# Patient Record
Sex: Female | Born: 1955 | Race: White | Hispanic: No | Smoking: Never smoker
Health system: Southern US, Community
[De-identification: ages and names within clinical notes are randomized; demographics above are authoritative.]

## PROBLEM LIST (undated history)

## (undated) DIAGNOSIS — C50919 Malignant neoplasm of unspecified site of unspecified female breast: Secondary | ICD-10-CM

---

## 2013-04-25 ENCOUNTER — Encounter (HOSPITAL_BASED_OUTPATIENT_CLINIC_OR_DEPARTMENT_OTHER): Payer: Self-pay | Admitting: Emergency Medicine

## 2013-04-25 ENCOUNTER — Emergency Department (HOSPITAL_BASED_OUTPATIENT_CLINIC_OR_DEPARTMENT_OTHER)
Admission: EM | Admit: 2013-04-25 | Discharge: 2013-04-25 | Disposition: A | Discharge status: Home / Self Care | Payer: BC Managed Care – PPO | Attending: Emergency Medicine | Admitting: Emergency Medicine

## 2013-04-25 ENCOUNTER — Emergency Department (HOSPITAL_BASED_OUTPATIENT_CLINIC_OR_DEPARTMENT_OTHER): Payer: BC Managed Care – PPO

## 2013-04-25 DIAGNOSIS — R220 Localized swelling, mass and lump, head: Secondary | ICD-10-CM | POA: Insufficient documentation

## 2013-04-25 DIAGNOSIS — IMO0002 Reserved for concepts with insufficient information to code with codable children: Secondary | ICD-10-CM | POA: Insufficient documentation

## 2013-04-25 DIAGNOSIS — C50919 Malignant neoplasm of unspecified site of unspecified female breast: Secondary | ICD-10-CM | POA: Insufficient documentation

## 2013-04-25 DIAGNOSIS — M6281 Muscle weakness (generalized): Secondary | ICD-10-CM | POA: Insufficient documentation

## 2013-04-25 DIAGNOSIS — F29 Unspecified psychosis not due to a substance or known physiological condition: Secondary | ICD-10-CM | POA: Insufficient documentation

## 2013-04-25 DIAGNOSIS — G9389 Other specified disorders of brain: Secondary | ICD-10-CM

## 2013-04-25 DIAGNOSIS — Z9889 Other specified postprocedural states: Secondary | ICD-10-CM | POA: Insufficient documentation

## 2013-04-25 DIAGNOSIS — Z853 Personal history of malignant neoplasm of breast: Secondary | ICD-10-CM | POA: Insufficient documentation

## 2013-04-25 DIAGNOSIS — R221 Localized swelling, mass and lump, neck: Principal | ICD-10-CM

## 2013-04-25 HISTORY — DX: Malignant neoplasm of unspecified site of unspecified female breast: C50.919

## 2013-04-25 LAB — COMPREHENSIVE METABOLIC PANEL
ALK PHOS: 176 U/L — AB (ref 39–117)
ALT: 52 U/L — AB (ref 0–35)
AST: 41 U/L — AB (ref 0–37)
Albumin: 3.7 g/dL (ref 3.5–5.2)
BILIRUBIN TOTAL: 1.4 mg/dL — AB (ref 0.3–1.2)
BUN: 14 mg/dL (ref 6–23)
CHLORIDE: 101 meq/L (ref 96–112)
CO2: 23 meq/L (ref 19–32)
Calcium: 8.7 mg/dL (ref 8.4–10.5)
Creatinine, Ser: 0.7 mg/dL (ref 0.50–1.10)
GFR calc Af Amer: >90 mL/min (ref >90)
Glucose, Bld: 209 mg/dL — ABNORMAL HIGH (ref 70–99)
Potassium: 3.8 mEq/L (ref 3.7–5.3)
SODIUM: 140 meq/L (ref 137–147)
Total Protein: 6.3 g/dL (ref 6.0–8.3)

## 2013-04-25 LAB — CBC
HEMATOCRIT: 42.1 % (ref 36.0–46.0)
HEMOGLOBIN: 14.2 g/dL (ref 12.0–15.0)
MCH: 32.7 pg (ref 26.0–34.0)
MCHC: 33.7 g/dL (ref 30.0–36.0)
MCV: 97 fL (ref 78.0–100.0)
Platelets: 108 10*3/uL — ABNORMAL LOW (ref 150–400)
RBC: 4.34 MIL/uL (ref 3.87–5.11)
RDW: 17.2 % — ABNORMAL HIGH (ref 11.5–15.5)
WBC: 17.9 10*3/uL — ABNORMAL HIGH (ref 4.0–10.5)

## 2013-04-25 LAB — DIFFERENTIAL
BAND NEUTROPHILS: 16 % — AB (ref 0–10)
BASOS PCT: 0 % (ref 0–1)
Basophils Absolute: 0 10*3/uL (ref 0.0–0.1)
Eosinophils Absolute: 0 10*3/uL (ref 0.0–0.7)
Eosinophils Relative: 0 % (ref 0–5)
LYMPHS PCT: 2 % — AB (ref 12–46)
Lymphs Abs: 0.4 10*3/uL — ABNORMAL LOW (ref 0.7–4.0)
Monocytes Absolute: 0.4 10*3/uL (ref 0.1–1.0)
Monocytes Relative: 2 % — ABNORMAL LOW (ref 3–12)
Neutro Abs: 17.1 10*3/uL — ABNORMAL HIGH (ref 1.7–7.7)
Neutrophils Relative %: 80 % — ABNORMAL HIGH (ref 43–77)

## 2013-04-25 LAB — PROTIME-INR
INR: 1.24 (ref 0.00–1.49)
Prothrombin Time: 15.3 seconds — ABNORMAL HIGH (ref 11.6–15.2)

## 2013-04-25 LAB — TROPONIN I: Troponin I: <0.3 ng/mL (ref <0.30)

## 2013-04-25 LAB — ETHANOL: Alcohol, Ethyl (B): <11 mg/dL (ref 0–11)

## 2013-04-25 LAB — APTT: APTT: 172 s — AB (ref 24–37)

## 2013-04-25 MED ORDER — IOHEXOL 350 MG/ML SOLN
80.0000 mL | Freq: Once | INTRAVENOUS | Status: AC | PRN
  Administered 2013-04-25: 80 mL via INTRAVENOUS

## 2013-04-25 MED ORDER — CEFTRIAXONE SODIUM 1 G IJ SOLR
INTRAMUSCULAR | Status: AC
  Filled 2013-04-25: qty 10

## 2013-04-25 MED ORDER — METHYLPREDNISOLONE SODIUM SUCC 125 MG IJ SOLR
125.0000 mg | Freq: Once | INTRAMUSCULAR | Status: AC
  Administered 2013-04-25: 125 mg via INTRAVENOUS
  Filled 2013-04-25: qty 2

## 2013-04-25 MED ORDER — DEXTROSE 5 % IV SOLN
1.0000 g | Freq: Once | INTRAVENOUS | Status: AC
  Administered 2013-04-25: 1 g via INTRAVENOUS

## 2013-04-25 NOTE — ED Notes (Signed)
MD at bedside. 

## 2013-04-25 NOTE — ED Provider Notes (Signed)
CSN: [id number]     Arrival date & time 04/25/13  1555 History  This chart was scribed for Shaune Pollack, MD by Vernell Barrier, ED scribe. This patient was seen in room MH02/MH02 and the patient's care was started at 4:27 PM. Level V caveat patient confusion Chief Complaint  Patient presents with  . Altered Mental Status   Patient is a 58 y.o. female presenting with altered mental status. The history is provided by the patient and a friend. No language interpreter was used.  Altered Mental Status Presenting symptoms: confusion and disorientation   Severity:  Mild Most recent episode:  Today Progression:  Worsening Context: recent illness   Associated symptoms: no fever and no headaches    HPI Comments: Jordan Morgan is a 58 y.o. female who presents to the Emergency Department brought in by her friend with altered mental status beginning approximately 11 AM this morning. Pt is unable to tell where she is, her birthday, and today's date. Pts friend says pt is disoriented and completely different from how she was 1 day prior.  Pt says she is having trouble finding words. Pt friend states she was unsteady walking in to the ER but was able to get in and out of the car. Pt is currently receiving chemotherapy for right breast cancer and has had a right mastectomy. Last chemo was 3 days ago. Pt denies smoking. Denies any other medical problems. Denies fever or HA. Further history obtained from daughter after she arrived. Daughter states that she has had some word finding difficulty for 6-8 weeks. It has been intermittent and not severe previously. Have not mentioned this to her oncologist. She has been taking chemotherapy to Fridays on and 1 Friday off with the last chemotherapy 9 days ago. Initial breast cancer was in 2007 time she had a mastectomy. She had recurrence of breast cancer in 2013 and has been on maintenance chemotherapy since that time. No past medical history on file. No past surgical  history on file. No family history on file. History  Substance Use Topics  . Smoking status: Not on file  . Smokeless tobacco: Not on file  . Alcohol Use: Not on file   OB History   No data available     Review of Systems  Constitutional: Negative for fever.  Neurological: Negative for headaches.  Psychiatric/Behavioral: Positive for confusion.   Allergies  Review of patient's allergies indicates no known allergies.  Home Medications  No current outpatient prescriptions on file. BP 118/65  Pulse 116  Temp(Src) 98.9 F (37.2 C) (Oral)  Resp 21  Ht 5\' 11"  (1.803 m)  Wt 175 lb (79.379 kg)  BMI 24.42 kg/m2  SpO2 98% Physical Exam  Nursing note and vitals reviewed. Constitutional: She appears well-developed and well-nourished.  HENT:  Head: Normocephalic and atraumatic.  Right Ear: External ear normal.  Left Ear: External ear normal.  Nose: Nose normal.  Mouth/Throat: Oropharynx is clear and moist.  Eyes: Conjunctivae and EOM are normal. Pupils are equal, round, and reactive to light.  Neck: Normal range of motion. Neck supple. No JVD present. No tracheal deviation present. No thyromegaly present.  Cardiovascular: Normal rate, regular rhythm, normal heart sounds and intact distal pulses.   Pulmonary/Chest: Effort normal and breath sounds normal. No respiratory distress. She has no wheezes.  Port in left chest. Right sided mastectomy.   Abdominal: Soft. Bowel sounds are normal. She exhibits no mass. There is no tenderness. There is no guarding.  Musculoskeletal: Normal  range of motion.  Weaker on left knee and hip flexors in comparison to right.  Lymphadenopathy:    She has no cervical adenopathy.  Neurological: She is alert. She has normal reflexes. She is disoriented. No cranial nerve deficit or sensory deficit. Gait normal.  Right palmar drift. Decreased ability to stand on left leg. Cranial nerves 3-12 grossly intact. DTRs normal and symmetric.   Skin: Skin is warm  and dry.  Psychiatric: She has a normal mood and affect. Her behavior is normal. Judgment and thought content normal.    ED Course  Procedures (including critical care time) DIAGNOSTIC STUDIES: Oxygen Saturation is 98% on room air, normal by my interpretation.    COORDINATION OF CARE: At 4:33 PM: Discussed treatment plan with patient which includes CT scan of the head. Patient agrees.   Labs Review Labs Reviewed  PROTIME-INR - Abnormal; Notable for the following:    Prothrombin Time 15.3 (*)    All other components within normal limits  APTT - Abnormal; Notable for the following:    aPTT 172 (*)    All other components within normal limits  CBC - Abnormal; Notable for the following:    WBC 17.9 (*)    RDW 17.2 (*)    Platelets 108 (*)    All other components within normal limits  DIFFERENTIAL - Abnormal; Notable for the following:    Neutrophils Relative % 80 (*)    Lymphocytes Relative 2 (*)    Monocytes Relative 2 (*)    Band Neutrophils 16 (*)    Neutro Abs 17.1 (*)    Lymphs Abs 0.4 (*)    All other components within normal limits  COMPREHENSIVE METABOLIC PANEL - Abnormal; Notable for the following:    Glucose, Bld 209 (*)    AST 41 (*)    ALT 52 (*)    Alkaline Phosphatase 176 (*)    Total Bilirubin 1.4 (*)    All other components within normal limits  ETHANOL  TROPONIN I  URINE RAPID DRUG SCREEN (HOSP PERFORMED)  URINALYSIS, ROUTINE W REFLEX MICROSCOPIC   Imaging Review Ct Head W Wo Contrast  04/25/2013   CLINICAL DATA:  57 year old female with altered mental status. History of breast cancer.  EXAM: CT HEAD WITHOUT AND WITH CONTRAST  TECHNIQUE: Contiguous axial images were obtained from the base of the skull through the vertex without and with intravenous contrast  CONTRAST:  54mL OMNIPAQUE IOHEXOL 350 MG/ML SOLN  COMPARISON:  None.  FINDINGS: A 4.5 x 5 cm left frontal cystic mass with slightly irregular peripheral enhancement is identified with large amount of  adjacent vasogenic edema and 9 mm left-to-right midline shift. Slightly increased density along the periphery of this cystic structure is noted on the noncontrast study and may represent developing calcifications or small areas of hemorrhage. This could represent metastatic disease or a primary neoplasm, but infection/ abscesses is not excluded.  There is no evidence of hydrocephalus. No acute or suspicious bony abnormalities are identified.  IMPRESSION: 4.5 x 5 cm peripherally enhancing left frontal cystic mass with vasogenic edema and 9 mm left to right midline shift. While this could represent primary neoplasm or metastatic disease (correlate with breast cancer stage), infection/ abscess is not excluded.  Results were called by telephone at the time of interpretation on 04/25/2013 at 6:40 PM to Dr. Pattricia Boss , who verbally acknowledged these results.   Electronically Signed   By: Hassan Rowan M.D.   On: 04/25/2013 18:41  EKG Interpretation    Date/Time:  Sunday April 25 2013 16:49:01 EST Ventricular Rate:  110 PR Interval:  166 QRS Duration: 112 QT Interval:  342 QTC Calculation: 462 R Axis:   -42 Text Interpretation:  Sinus tachycardia Left axis deviation Low voltage QRS Right bundle branch block Abnormal ECG Confirmed by Alexxis Mackert MD, Cristopher Ciccarelli ([id number]) on 04/25/2013 7:10:19 PM            MDM   Final diagnoses:  None   Is a 58 year old lady with a history of breast cancer on maintenance chemotherapy who presents today with some confusion and word finding difficulties. CT reveals 4.5 x 5 cm enhancing left frontal cystic mass with vasogenic edema and 9 mm left-to-right shift. Patient was given Solu-Medrol here. She does have an elevated white blood cell count but has had no other symptoms of infection. She is given Rocephin 1 g here. I have discussed the patient's care with Dr. Langston Masker at [facility] on call for [facility]. He plans to contact the patient's  oncologist. The patient is to be transferred to [facility] for further treatment. I have discussed this with the patient and her family and they voice understanding and agreement.  I personally performed the services described in this documentation, which was scribed in my presence. The recorded information has been reviewed and considered.    Shaune Pollack, MD 04/25/13 [phone]

## 2013-04-25 NOTE — ED Notes (Signed)
Pt departing for [facility]

## 2013-04-25 NOTE — ED Notes (Signed)
Patient has a friend with her and states that this morning around 11am the patient was found to be slightly confused, unable to answer simple questions, forgetting details which is very unlike her according to the friend. The patient states that she does recognize that she is having some confusion and that she woke up this morning noticing this, but last night when she went to bed she felt fine. Patient is currently in treatment for breast cancer and has been on chemo for 2 months, although the patient seems unsure if that is the correct answer. Patient unable to name where she is, unable to state the date, even the month and year, unable to name her daughters, unable to state her full name. No weakness on either side. Face symmetrical, pupils equal.

## 2013-04-25 NOTE — ED Notes (Signed)
Unable to void after escort to [facility]

## 2014-04-04 DEATH — deceased

## 2015-01-21 IMAGING — CT CT HEAD WO/W CM
1 of 2 series · 13 of 30 positions shown, 17 images · IV contrast (APPLIED)
Comparison: None.

CLINICAL DATA: 57-year-old female with altered mental status.
History of breast cancer.

EXAM:
CT HEAD WITHOUT AND WITH CONTRAST
TECHNIQUE: Contiguous axial images were obtained from the base of the skull
through the vertex without and with intravenous contrast
CONTRAST:  80mL OMNIPAQUE IOHEXOL 350 MG/ML SOLN

[Series 2: head w/o 4.8 h37s · axial · non-contrast · 0.59mm/px · z∈[-98,+48]mm · 13 of 36 slices shown, 17 images]
[im 3/36  brain]
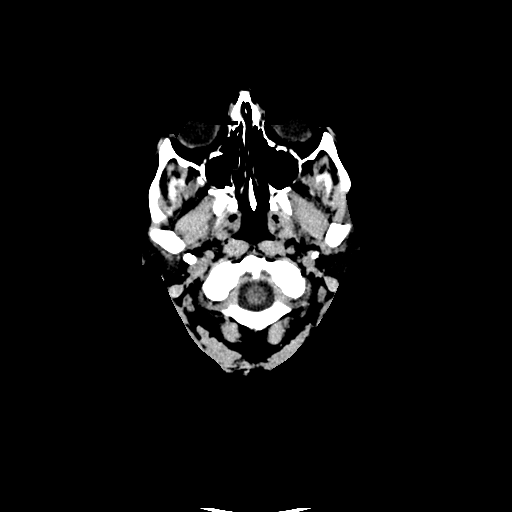
[im 3/36  bone]
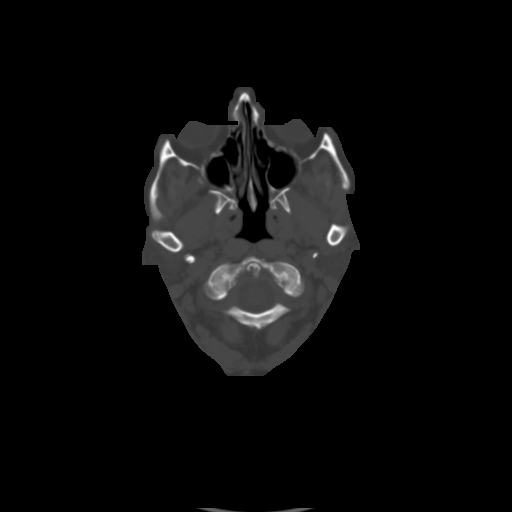
[im 6/36  brain]
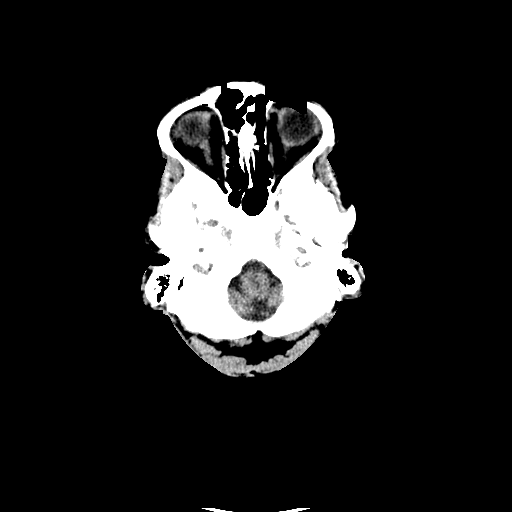
[im 8/36  brain]
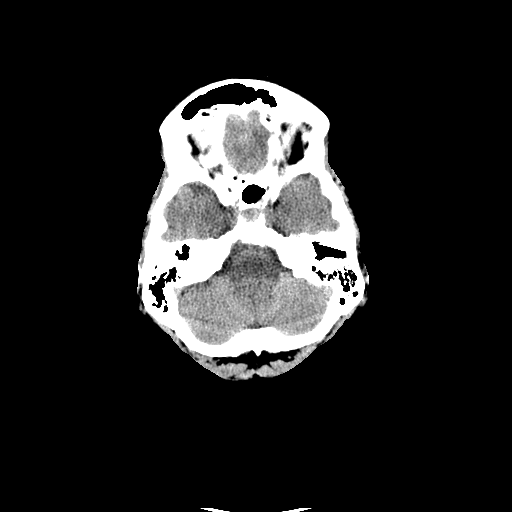
[im 11/36  brain]
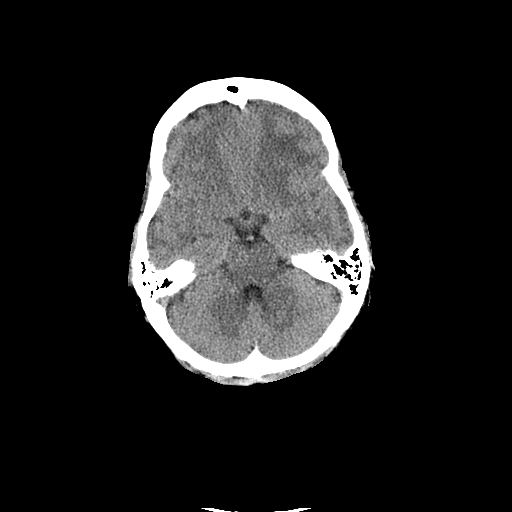
[im 13/36  brain]
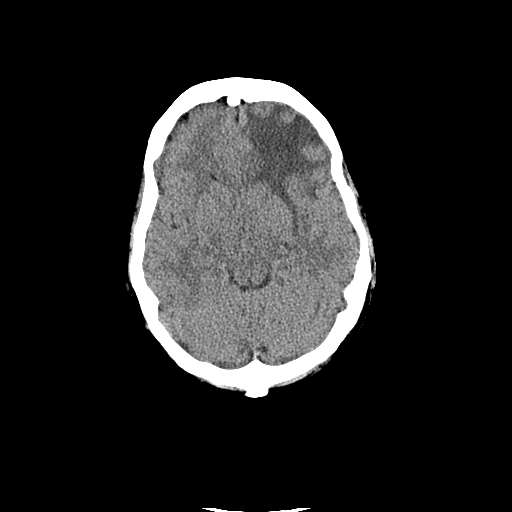
[im 13/36  bone]
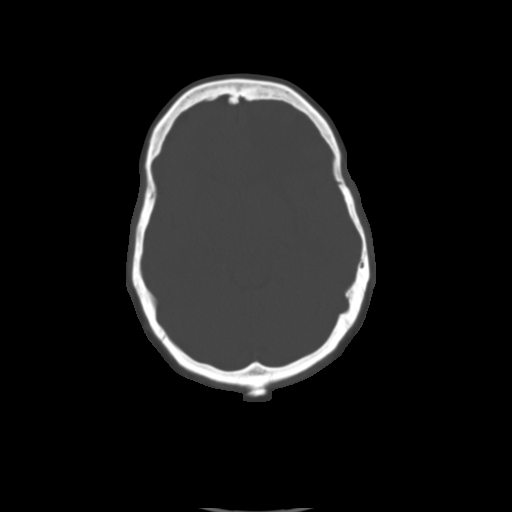
[im 16/36  brain]
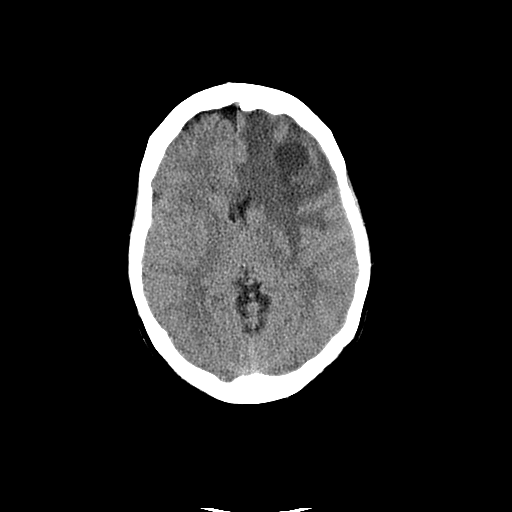
[im 18/36  brain]
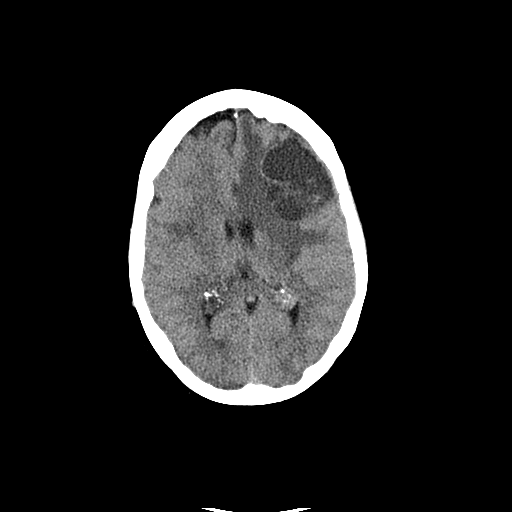
[im 21/36  brain]
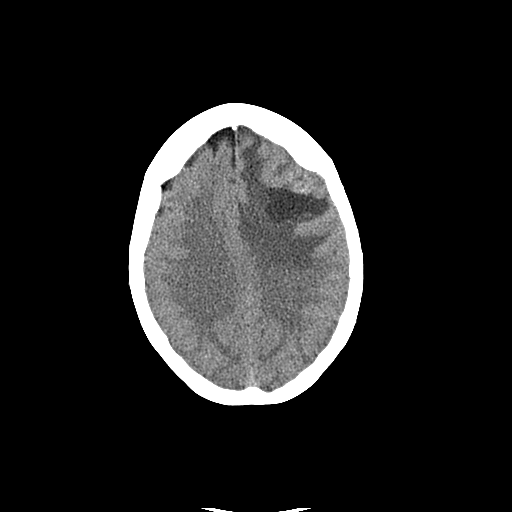
[im 23/36  brain]
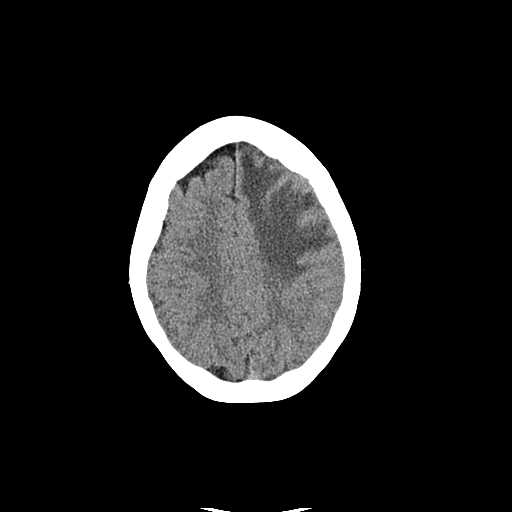
[im 23/36  bone]
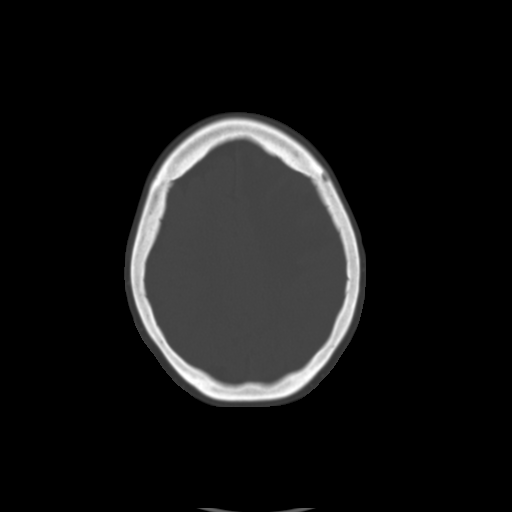
[im 26/36  brain]
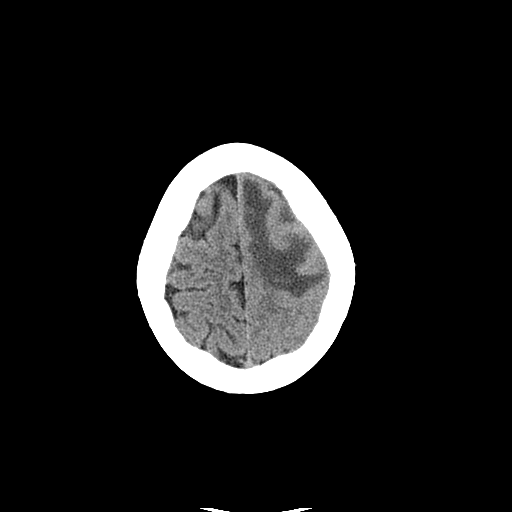
[im 28/36  brain]
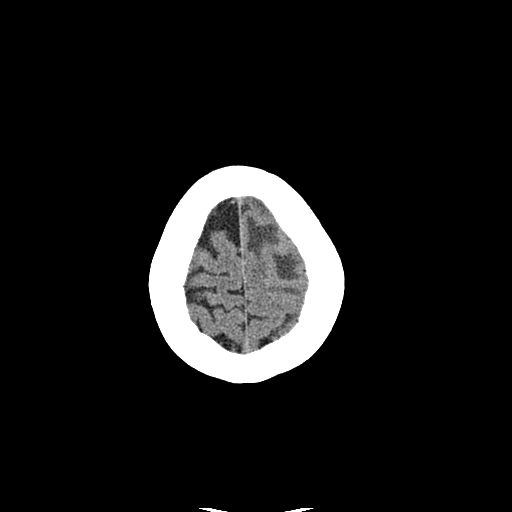
[im 31/36  brain]
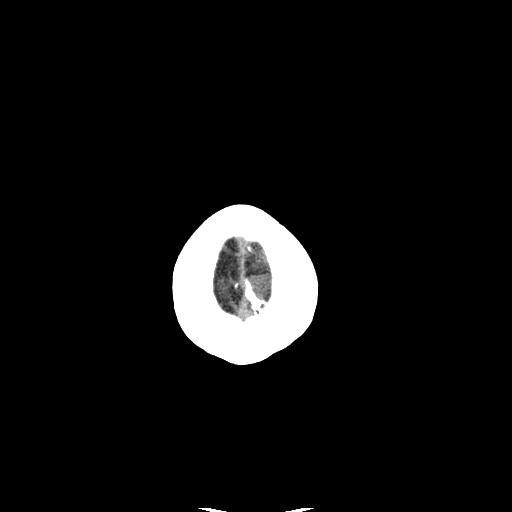
[im 33/36  brain]
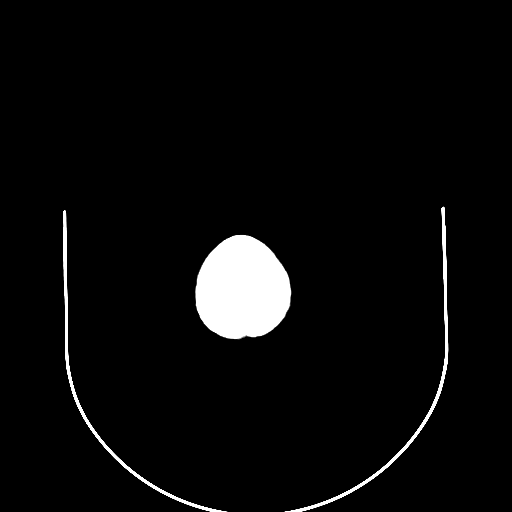
[im 33/36  bone]
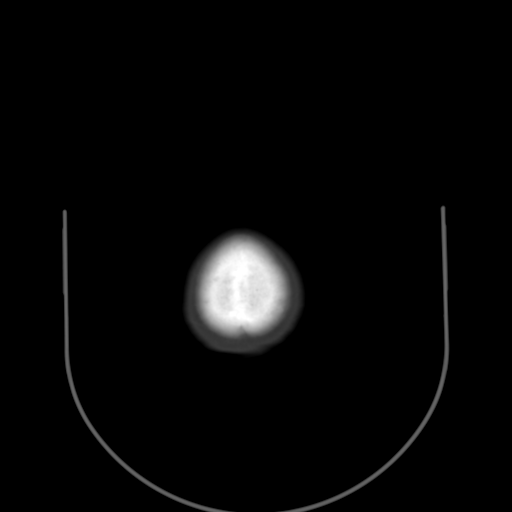

[13 of 30 positions shown; findings below may reference images not displayed]

FINDINGS: A 4.5 x 5 cm left frontal cystic mass with slightly irregular
peripheral enhancement is identified with large amount of adjacent
vasogenic edema and 9 mm left-to-right midline shift. Slightly
increased density along the periphery of this cystic structure is
noted on the noncontrast study and may represent developing
calcifications or small areas of hemorrhage. This could represent
metastatic disease or a primary neoplasm, but infection/ abscesses
is not excluded.

There is no evidence of hydrocephalus. No acute or suspicious bony
abnormalities are identified.
IMPRESSION: 4.5 x 5 cm peripherally enhancing left frontal cystic mass with
vasogenic edema and 9 mm left to right midline shift. While this
could represent primary neoplasm or metastatic disease (correlate
with breast cancer stage), infection/ abscess is not excluded.

Results were called by telephone at the time of interpretation on
04/25/2013 at [DATE] to Dr. RABYA COMPEAN , who verbally acknowledged
these results.
